# Patient Record
Sex: Female | Born: 1980 | Hispanic: Yes | Marital: Married | Smoking: Never smoker
Health system: Southern US, Community
[De-identification: ages and names within clinical notes are randomized; demographics above are authoritative.]

---

## 2022-01-04 ENCOUNTER — Other Ambulatory Visit: Payer: Self-pay

## 2022-01-04 DIAGNOSIS — N6310 Unspecified lump in the right breast, unspecified quadrant: Secondary | ICD-10-CM

## 2022-01-16 ENCOUNTER — Ambulatory Visit
Admission: RE | Admit: 2022-01-16 | Discharge: 2022-01-16 | Disposition: A | Discharge status: Home / Self Care | Payer: Self-pay | Source: Ambulatory Visit | Attending: Obstetrics and Gynecology | Admitting: Obstetrics and Gynecology

## 2022-01-16 ENCOUNTER — Other Ambulatory Visit: Payer: Self-pay | Admitting: Obstetrics and Gynecology

## 2022-01-16 ENCOUNTER — Ambulatory Visit
Admission: RE | Admit: 2022-01-16 | Discharge: 2022-01-16 | Disposition: A | Discharge status: Home / Self Care | Payer: No Typology Code available for payment source | Source: Ambulatory Visit | Attending: Obstetrics and Gynecology | Admitting: Obstetrics and Gynecology

## 2022-01-16 ENCOUNTER — Ambulatory Visit: Payer: Self-pay | Admitting: *Deleted

## 2022-01-16 VITALS — BP 106/72 | Wt 128.4 lb

## 2022-01-16 DIAGNOSIS — Z01419 Encounter for gynecological examination (general) (routine) without abnormal findings: Secondary | ICD-10-CM

## 2022-01-16 DIAGNOSIS — N6310 Unspecified lump in the right breast, unspecified quadrant: Secondary | ICD-10-CM

## 2022-01-16 DIAGNOSIS — N644 Mastodynia: Secondary | ICD-10-CM

## 2022-01-16 NOTE — Progress Notes (Signed)
[name] is a 41 y.o. No obstetric history on file. female who presents to [facility] clinic today with complaint of right outer breast lump and pain x one month. Patient states the pain comes and goes. Patient rates the pain at a 5-6 out of 10.    Pap Smear: Pap smear completed today. Last Pap smear was 5 years ago at a clinic in [place] and was normal per patient. Per patient has no history of an abnormal Pap smear. Last Pap smear result is not available in Epic.   Physical exam: Breasts Breasts symmetrical. No skin abnormalities bilateral breasts. No nipple retraction bilateral breasts. No nipple discharge bilateral breasts. No lymphadenopathy. No lumps palpated bilateral breasts. Unable to palpate a lump within patients area of concern within the right outer breast. Complaints of right outer breast pain on exam.     Pelvic/Bimanual Ext Genitalia No lesions, no swelling and no discharge observed on external genitalia.        Vagina Vagina pink and normal texture. No lesions and brownish blood observed in vagina consistent with patients menstrual period.        Cervix Cervix is present. Cervix pink and of normal texture. Brownish blood observed at cervical os consistent with patients menstrual period.   Uterus Uterus is present and palpable. Uterus in normal position and normal size.        Adnexae Bilateral ovaries present and palpable. No tenderness on palpation.         Rectovaginal No rectal exam completed today since patient had no rectal complaints. No skin abnormalities observed on exam.     Smoking History: Patient has never smoked.   Patient Navigation: Patient education provided. Access to services provided for patient through [facility] program. Spanish interpreter Natale Lay from [facility] provided. Patient has food insecurities. Patient escorted to the food market at the [employer] for groceries.   Breast and Cervical Cancer Risk  Assessment: Patient has family history of her sister having breast cancer. Patient has no known genetic mutations or history of radiation treatment to the chest before age 23. Patient does not have history of cervical dysplasia, immunocompromised, or DES exposure in-utero.  Risk Assessment     Risk Scores       01/16/2022   Last edited by: Meryl Dare, CMA   5-year risk: 0.5 %   Lifetime risk: 11.9 %            A: BCCCP exam with pap smear Complaint of right outer breast lump and pain.  P: Referred patient to the [facility] for a diagnostic mammogram. Appointment scheduled Tuesday, Jan 16, 2022 at 1240.  Priscille Heidelberg, RN 01/16/2022 9:56 AM

## 2022-01-16 NOTE — Patient Instructions (Signed)
Explained breast self awareness with Barbee Shropshire Maharaj. Pap smear completed today. Let her know BCCCP will cover Pap smears and HPV typing every 5 years unless has a history of abnormal Pap smears. Referred patient to the [facility] for a diagnostic mammogram. Appointment scheduled Tuesday, Jan 16, 2022 at 1240. Patient aware of appointment and will be there. Let patient know will follow up with her within the next couple weeks with results of Pap smear by phone. Barbee Shropshire Seltzer verbalized understanding.  Jaeshawn Silvio, Kathaleen Maser, RN 9:58 AM

## 2022-01-23 LAB — CYTOLOGY - PAP
Comment: NEGATIVE
Diagnosis: NEGATIVE
Diagnosis: REACTIVE
High risk HPV: POSITIVE — AB

## 2022-01-23 NOTE — Progress Notes (Signed)
Repeat in 1 year with cotesting

## 2022-01-24 ENCOUNTER — Telehealth: Payer: Self-pay

## 2022-01-24 NOTE — Telephone Encounter (Signed)
Called patient via [facility] [employer] # [phone] to give pap smear results. 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 Based on this result her next pap smear will be due in 1 year. Answered any questions that patient had regarding result. Patient voiced understanding.

## 2022-01-26 ENCOUNTER — Ambulatory Visit
Admission: RE | Admit: 2022-01-26 | Discharge: 2022-01-26 | Disposition: A | Discharge status: Home / Self Care | Payer: No Typology Code available for payment source | Source: Ambulatory Visit | Attending: Obstetrics and Gynecology | Admitting: Obstetrics and Gynecology

## 2022-01-26 DIAGNOSIS — N6310 Unspecified lump in the right breast, unspecified quadrant: Secondary | ICD-10-CM

## 2022-02-12 ENCOUNTER — Other Ambulatory Visit: Payer: Self-pay | Admitting: Obstetrics and Gynecology

## 2022-02-12 DIAGNOSIS — N6310 Unspecified lump in the right breast, unspecified quadrant: Secondary | ICD-10-CM

## 2022-02-15 ENCOUNTER — Ambulatory Visit: Payer: Self-pay | Admitting: *Deleted

## 2022-02-15 ENCOUNTER — Ambulatory Visit
Admission: RE | Admit: 2022-02-15 | Discharge: 2022-02-15 | Disposition: A | Discharge status: Home / Self Care | Payer: No Typology Code available for payment source | Source: Ambulatory Visit | Attending: Obstetrics and Gynecology | Admitting: Obstetrics and Gynecology

## 2022-02-15 VITALS — BP 102/68 | Wt 132.7 lb

## 2022-02-15 DIAGNOSIS — N644 Mastodynia: Secondary | ICD-10-CM

## 2022-02-15 DIAGNOSIS — N6310 Unspecified lump in the right breast, unspecified quadrant: Secondary | ICD-10-CM

## 2022-02-15 DIAGNOSIS — Z1239 Encounter for other screening for malignant neoplasm of breast: Secondary | ICD-10-CM

## 2022-02-15 NOTE — Progress Notes (Signed)
Ms. [place] Huberty is a 41 y.o. female who presents to [facility] clinic today with complaint of right upper outer breast  pain and lump. Patient stated the pain started after her right breast biopsy 01/26/2022 that comes and goes. Patient rates the pain at at 7-8 out of 10. Patient stated one week after the biopsy she noticed a lump and swelling.    Pap Smear: Pap smear not completed today. Last Pap smear was 01/16/2022 at [facility] clinic and was normal with positive HPV. Per patient has no history of an abnormal Pap smear. Last Pap smear result is available in Epic.   Physical exam: Breasts Breasts symmetrical. No skin abnormalities bilateral breasts. No nipple retraction bilateral breasts. No nipple discharge bilateral breasts. No lymphadenopathy. No lumps palpated bilateral breasts. Unable to palpate a lump within patients area of concern within the right outer breast. Swelling right upper breast on exam. Complaints of right upper outer breast pain on exam.    MM RT BREAST BX W LOC DEV 1ST LESION IMAGE BX SPEC STEREO GUIDE  Addendum Date: 01/31/2022   ADDENDUM REPORT: 01/31/2022 08:10 ADDENDUM: Pathology revealed Breast, right, needle core biopsy, upper, x shaped clip- BENIGN BREAST PARENCHYMA WITH FOCAL FIBROCYSTIC CHANGE. - NEGATIVE FOR CARCINOMA. This was found to be concordant by Dr. Baird Lyons. Pathology results were discussed with the patient by telephone with Durene Cal Bilingual Patient Services Representative. The patient reported doing well after the biopsy with tenderness at the site. Post biopsy instructions and care were reviewed and questions were answered. The patient was encouraged to call The [facility] for any additional concerns. The patient was instructed to continue with monthly self breast examinations, clinical follow-up as needed, and to return for annual mammography at 42. The patient was informed a reminder notice would be sent regarding this  appointment. Pathology results reported by Collene Mares RN on 01/30/2022. Electronically Signed   By: Baird Lyons M.D.   On: 01/31/2022 08:10   Result Date: 01/31/2022 CLINICAL DATA:  Right breast mass. EXAM: Choose 3 BREAST STEREOTACTIC CORE NEEDLE BIOPSY COMPARISON:  None Available. FINDINGS: The patient and I discussed the procedure of stereotactic-guided biopsy including benefits and alternatives. We discussed the high likelihood of a successful procedure. We discussed the risks of the procedure including infection, bleeding, tissue injury, clip migration, and inadequate sampling. Informed written consent was given. The usual time out protocol was performed immediately prior to the procedure. Using sterile technique and 1% lidocaine and 1% lidocaine with epinephrine as local anesthetic, under stereotactic guidance, a 9 gauge vacuum assisted device was used to perform core needle biopsy of an asymmetry in the upper slightly outer quadrant of the right breast using a lateral to medial approach. Lesion quadrant: Upper-outer quadrant At the conclusion of the procedure, X shaped tissue marker clip was deployed into the biopsy cavity. Follow-up 2-view mammogram was performed and dictated separately. IMPRESSION: Stereotactic-guided biopsy of the right breast. No apparent complications. Electronically Signed: By: Baird Lyons M.D. On: 01/26/2022 09:33  MM CLIP PLACEMENT RIGHT  Result Date: 01/26/2022 CLINICAL DATA:  Status post stereotactic biopsy of the right breast. EXAM: 3D DIAGNOSTIC RIGHT MAMMOGRAM POST STEREOTACTIC BIOPSY COMPARISON:  Previous exam(s). FINDINGS: 3D Mammographic images were obtained following stereotactic guided biopsy of the right breast. The biopsy marking clip is in the far posterior aspect of the upper-outer quadrant of the right breast. IMPRESSION: Appropriate positioning of the X shaped biopsy marking clip at the site of biopsy in  the upper-outer quadrant of the right breast. Final  Assessment: Post Procedure Mammograms for Marker Placement Electronically Signed   By: Baird Lyons M.D.   On: 01/26/2022 09:34  MS DIGITAL DIAG TOMO BILAT  Result Date: 01/16/2022 CLINICAL DATA:  Palpable lump in the right breast. Baseline mammography. Milky nipple discharge on the right. EXAM: DIGITAL DIAGNOSTIC BILATERAL MAMMOGRAM WITH TOMOSYNTHESIS AND CAD; ULTRASOUND RIGHT BREAST LIMITED TECHNIQUE: Bilateral digital diagnostic mammography and breast tomosynthesis was performed. The images were evaluated with computer-aided detection.; Targeted ultrasound examination of the right breast was performed COMPARISON:  None available. ACR Breast Density Category c: The breast tissue is heterogeneously dense, which may obscure small masses. FINDINGS: There is a mass in the superior right breast on the MLO view only just above the level of the nipple at a posterior depth measuring 1.4 cm. The mass persists on additional imaging. No other suspicious mammographic findings are identified bilaterally. On physical exam, no suspicious lumps are identified. Targeted ultrasound is performed, showing no abnormality in the retroareolar region to correlate with milky discharge. No abnormality identified in the region of the palpable lump on the right. No sonographic correlate for the right breast mass only seen on the MLO view at a posterior depth just above the level the nipple. IMPRESSION: Indeterminate right breast mass only visualized on the MLO view, not definitely a lymph node. No cause for the palpable lump identified. No cause for the milky nipple discharge identified. No suspicious findings otherwise seen in either breast. RECOMMENDATION: Recommend stereotactic biopsy of the right breast mass located on the MLO view at a posterior depth. I have discussed the findings and recommendations with the patient. If applicable, a reminder letter will be sent to the patient regarding the next appointment. BI-RADS CATEGORY  4:  Suspicious. Electronically Signed   By: Gerome Sam III M.D.   On: 01/16/2022 14:42    Pelvic/Bimanual Pap is not indicated today per BCCCP guidelines.   Smoking History: Patient has never smoked.   Patient Navigation: Patient education provided. Access to services provided for patient through [facility] program. Spanish interpreter Alene Mires from [facility] provided. Patient has food insecurities. Patient escorted to the food market at the [employer] for groceries.   Breast and Cervical Cancer Risk Assessment: Patient has family history of her sister having breast cancer. Patient has no known genetic mutations or history of radiation treatment to the chest before age 23. Patient does not have history of cervical dysplasia, immunocompromised, or DES exposure in-utero.  Risk Assessment     Risk Scores       02/15/2022 01/16/2022   Last edited by: Meryl Dare, CMA Meryl Dare, CMA   5-year risk: 0.5 % 0.5 %   Lifetime risk: 11.9 % 11.9 %            A: BCCCP exam without pap smear Complaint of right outer breast lump and pain after breast biopsy.  P: Referred patient to the [facility] for a right breast diagnostic mammogram. Appointment scheduled Thursday, February 15, 2022 at 1320.  Priscille Heidelberg, RN 02/15/2022 11:00 AM

## 2022-02-15 NOTE — Patient Instructions (Signed)
Explained breast self awareness with Barbee Shropshire Kerce. Patient did not need a Pap smear today due to last Pap smear and HPV typing was 01/16/2022. Let her know that her next Pap smear is due in one year due to it was negative with positive HPV. Referred patient to the [facility] for a right breast diagnostic mammogram. Appointment scheduled Thursday, February 15, 2022 at 1320. Patient aware of appointment and will be there. Barbee Shropshire Shafer verbalized understanding.  Alline Pio, Kathaleen Maser, RN 11:00 AM

## 2022-07-18 NOTE — ED Triage Notes (Signed)
 Interpreter [phone] patient c/o lower abd pain, and lower back pain since yesterday. Also, reports 3 positive pregnancy test at home. Also, reports urinary frequency and dysuria.

## 2022-07-18 NOTE — ED Notes (Addendum)
 Pt resting comfortably in bed. RR even and unlabored. No distress noted. Pt provided with warm blanket. Pt has no other request at this time. Bed locked and low. Call bell with reach. Family member at bedside.

## 2022-07-18 NOTE — ED Notes (Signed)
 Interpreter Louis [phone]

## 2022-07-18 NOTE — ED Provider Notes (Signed)
 ------------------------------------------------------------------------------- Attestation signed by Seaback, [place] Causey, MD at 07/19/2022  4:32 AM I was the attending physician on duty at the time the patient visited the ED.  The patient was evaluated by the PA/NP.  I was personally available for consultation, however, I did not see the patient nor participate in the medical decision making of the encounter unless otherwise documented.  I am administratively signing the chart. -------------------------------------------------------------------------------  Patient placed in First Look pathway, seen and evaluated for chief complaint of lower abdominal/back pain. Pertinent HPI findings include associated LMP 05/29/22, positive pregnancy test at home. Denies vaginal bleeding. Pertinent exam findings include non-toxic in appearance. Patient counseled on process, plan, and necessity for staying for completing the evaluation.   This document serves as a record of services personally performed by Charleen Fox PA-C. 5:58 PM  Interpreter [phone]  [facility] Emergency Department Emergency Department Provider Note  This document was created using the aid of voice recognition Dragon dictation software.   Provider at bedside: 07/18/2022 9:49 PM  History obtained from the: Patient  History   Chief Complaint  Patient presents with  . Abdominal Pain    HPI  Renee Knight is a 41 y.o. female G4P3 who presents to the ED with complaints of lower abdominal and back pain.  Patient states she first noted pain a few weeks ago.  States she took a at home pregnancy test few days ago which was notably positive.  LMP 05/29/2022.  She denies any vaginal bleeding.  Endorses associated urinary frequency and dysuria.  Denies any fever.  Denies any nausea, vomiting, diarrhea or other medical complaints at this time.   9:49 PM Previous medical records reviewed from [facility] Care Everywhere and  EPIC Chart Review.   Patient's last menstrual period was 05/29/2022 (exact date).  Past Medical History History reviewed. No pertinent past medical history.  Past Surgical History History reviewed. No pertinent surgical history.  Medications These were reviewed. See nursing note for details.  Allergies Patient has no known allergies.  Family History History reviewed. No pertinent family history.  Social History Social History   Tobacco Use  . Smoking status: Never  . Smokeless tobacco: Never  Substance Use Topics  . Alcohol use: Not Currently  . Drug use: Not Currently    All family history, social history and PMH were reviewed by myself. See nursing note for details.   Review of Systems  Review of Systems  Gastrointestinal: Positive for abdominal pain.  Genitourinary: Positive for dysuria and frequency.  Musculoskeletal: Positive for back pain.    Physical Exam   Vitals:   07/18/22 2115 07/18/22 2130 07/18/22 2145 07/18/22 2203  BP:      Pulse:      Temp:      Resp:      SpO2: 99% 98% 99%   PainSc:    0-Zero    Physical Exam Vitals and nursing note reviewed.  Constitutional:      General: She is not in acute distress.    Appearance: She is well-developed.  HENT:     Head: Normocephalic and atraumatic.     Mouth/Throat:     Mouth: Mucous membranes are moist.  Eyes:     Extraocular Movements: Extraocular movements intact.  Cardiovascular:     Rate and Rhythm: Normal rate and regular rhythm.     Heart sounds: Normal heart sounds.  Pulmonary:     Effort: Pulmonary effort is normal.     Breath sounds:  Normal breath sounds.  Abdominal:     General: There is no distension.     Palpations: Abdomen is soft.     Tenderness: There is abdominal tenderness (Mild distractible generalized nominal tenderness). There is no right CVA tenderness or left CVA tenderness.  Genitourinary:    Comments: Deferred Musculoskeletal:     Comments: Bilateral lumbar  paraspinal tenderness.  Neurological:     Mental Status: She is alert.     Results  LABS Labs Reviewed  COMPREHENSIVE METABOLIC PANEL - Abnormal; Notable for the following components:      Result Value   Potassium 3.4 (*)    CO2 21 (*)    All other components within normal limits  URINALYSIS WITH MICROSCOPIC - Abnormal; Notable for the following components:   Urine Appearance Cloudy (*)    Urine Specific Gravity 1.034 (*)    Urine Protein 30 (*)    Urine Ketone Trace (*)    Urine Blood/Hb 3+ (*)    Urine Leukocyte Esterase 250 (*)    Urine WBC 6 (*)    Urine RBC 25 (*)    Urine Squamous Epithelial Cells TNTC (*)    All other components within normal limits  HCG - Abnormal; Notable for the following components:   HCG 85,952 (*)    All other components within normal limits  URINE CULTURE  CBC AND DIFFERENTIAL   Narrative:    The following orders were created for panel order CBC and Differential. Procedure                               Abnormality         Status                    ---------                               -----------         ------                    CBC and Differential[[id number]]                             Final result               Please view results for these tests on the individual orders.  HOLD FOR URINE CULTURE  CBC WITH AUTO DIFFERENTIAL PANEL  EXTRA TUBE DRAW   Narrative:    The following orders were created for panel order Extra Tube Draw. Procedure                               Abnormality         Status                    ---------                               -----------         ------                    Hnoi[[id number]]  In process                Light Aolz[[id number]]                                       In process                 Please view results for these tests on the individual orders.  GOLD  LIGHT BLUE    Radiology   Results for orders placed or performed during the hospital encounter  of 07/18/22  US  PREGNANT PELVIS 1ST TRIMESTER & TRANSVAGINAL   Narrative   CLINICAL DATA:  Pregnant patient in first-trimester pregnancy with abdominal and pelvic pain.  EXAM: OBSTETRIC <14 WK US  AND TRANSVAGINAL OB US   TECHNIQUE: Both transabdominal and transvaginal ultrasound examinations were performed for complete evaluation of the gestation as well as the maternal uterus, adnexal regions, and pelvic cul-de-sac. Transvaginal technique was performed to assess early pregnancy.  COMPARISON:  None Available.  FINDINGS: Intrauterine gestational sac: Single  Yolk sac:  Visualized.  Embryo:  Visualized.  Cardiac Activity: Visualized.  Heart Rate: 157 bpm  CRL:  3.9 mm   6 w   0 d                  US  EDC: 03/13/2023  Subchorionic hemorrhage:  Small, 5 x 11 mm..  Maternal uterus/adnexae: The right ovary is visualized and is normal. Ovarian blood flow is demonstrated. The left ovary is normal in size. Physiologic follicle. Ovarian blood flow is seen. No adnexal mass. No pelvic free fluid.  IMPRESSION: 1. Single live intrauterine pregnancy estimated gestational age based on crown-rump length 6 weeks 0 days for ultrasound [facility] 03/13/2023. 2. Small subchorionic hemorrhage.   Electronically Signed   By: Andrea Gasman M.D.   On: 07/18/2022 20:52      ED Course   ED Course as of 07/18/22 2349  Wed Jul 18, 2022  2114 WBC: 7.9 [KL]  2114 HCG(!): 14,047 [KL]  2137 Urinalysis With Microscopic(!) Leuks, WBC noted on UA, suspect contaminated urine with TNTC sqaum cells noted. However patient is presenting with dysuria, in the setting of early pregnancy will treat with abx, cultures pending  [KL]    ED Course User Index [KL] Olene Charleen Maxwell, PA-C    Medications Given in Emergency Department  Medications - No data to display   Medical Decision Making  Medical Decision Making Abdominal pain, acute, generalized: acute illness or injury with systemic  symptoms Dysuria: acute illness or injury with systemic symptoms First trimester pregnancy: acute illness or injury with systemic symptoms Amount and/or Complexity of Data Reviewed Independent Historian: spouse External Data Reviewed: notes. Labs: ordered. Decision-making details documented in ED Course. Radiology: ordered and independent interpretation performed. Decision-making details documented in ED Course.   Risk OTC drugs. Prescription drug management.       Additional Information: I reviewed the patient's past medical history, including notes from our facility and what is available in CareEverywhere.  Differentials considered: appendicitis, bowel obstruction, AAA, UTI, pyelonephritis, nephrolithiasis, pancreatitis, cholecystitis, shingles, perforated bowel or ulcer, diverticulitis, mesenteric ischemia, inflammatory bowel disease, or strangulated/incarcerated hernia.  On my initial exam, the pt was hemodynamically stable, nontoxic-appearing and in no acute distress.  Physical examination as described above.  Upon work-up today, CBC without leukocytosis, anemia or thrombocytopenia.  CMP without significant electrolyte disturbances, renal hepatic function within normal  limits.  hCG is elevated consistent with early pregnancy.  Urinalysis with leuks WBCs and RBCs however several urine squames are also noted.  Patient is symptomatic.  Will treat with Keflex.  Ultrasound does reveal approximate 6-week gestation IUP.  Suspect symptoms are related to early pregnancy.  Overall work-up and exam today is reassuring.  No further emergent evaluation necessary at this time.    The following decision tools were used to help assess clinical picture and make decisions: Patient's history, physical exam, labs, imaging   ED Clinical Impression   Clinical Complexity Number and complexity of problems addressed: Patient's presentation is most consistent with acute illness / injury with systemic  symptoms..  Amount and/or complexity of data reviewed, analyzed: Assessment requiring independent historians used: Patient's spouse, at bedside  External notes reviewed: Prior PCP visit, prior emergency department visits from outside hospital, prior subspecialist consultation visit  ER provider interpretation of Imaging / Radiology: IUP noted on ultrasound  ER provider interpretation of Labs: No anemia on CBC  All radiology studies reviewed independently, additionally reading provided by radiologist available, unless otherwise noted.    Risk of complications and/or morbidity or mortality of patient management: Patient's impaired access to primary care increases the complexity of managing their  presentation.   Plan: Discharge patient home with prescription of Keflex to be taken 4 times daily for 7 days.  Patient also given prescription for prenatal vitamins.  Discussed Tylenol  for pain.  Referral to OB placed.  Return to ER precautions given.  Patient agreeable to and expressed understanding of above.  Clinical picture was discussed with patient along with risks and benefits of management options, shared decision making we will discharge the patient home with close outpatient follow-up for continued management. Based on the above findings, I believe patient is hemodynamically stable for discharge.   The following prescriptions were given for continued management of symptoms or pathologies: Keflex, prenatal vitamin  Patient/and family educated about specific return precautions for given chief complaint and symptoms.  Patient/and family educated about follow-up with PCP and OBGYN.  Patient/and family expressed understanding of return precautions and need for follow-up.  Patient discharged. Diagnosis, treatment, plan discussed with patient.  All questions were answered to the patient's satisfaction.   If patient was given medication(s), adverse effects, black box warnings, and drug  interactions were discussed with patient.  Patient instructed to follow-up here in the emergency department for new or worsening symptoms.     1. Abdominal pain, acute, generalized   2. Dysuria   3. First trimester pregnancy      Medication List    START taking these medications   cephalexin 500 MG capsule Commonly known as: KEFLEX Take 1 capsule (500 mg total) by mouth 4 times daily for 7 days.   Prenatal Complete 14 mg iron- 400 mcg Tab Generic drug: prenatal 21-iron fu-folic acid Take 1 tablet by mouth daily.     ASK your doctor about these medications   aspirin-acetaminophen -caffeine 250-250-65 mg per tablet Commonly known as: EXCEDRIN MIGRAINE       Where to Get Your Medications    These medications were sent to [id number] - [place], [place] - [address]  [address], [place] [address]   Phone: [phone]  cephalexin 500 MG capsule Prenatal Complete 14 mg iron- 400 mcg Tab    FOLLOW UP Emergency - [place], [facility] [address] [place]  [address] [phone] Go to  If symptoms worsen, As needed  _____________________________  Attestation: Charleen Fox, PA-C obtained and performed the history, physical exam and medical decision making elements that were entered into the chart. 07/18/2022  11:49 PM          Electronically signed by: Fox Charleen Maxwell, PA-C 07/18/22 2349    Electronically signed by: Seaback, [place] Causey, MD 07/19/22 [phone]

## 2022-08-09 ENCOUNTER — Inpatient Hospital Stay (HOSPITAL_COMMUNITY)
Admission: AD | Admit: 2022-08-09 | Discharge: 2022-08-10 | Disposition: A | Discharge status: Home / Self Care | Payer: No Typology Code available for payment source | Attending: Obstetrics and Gynecology | Admitting: Obstetrics and Gynecology

## 2022-08-09 ENCOUNTER — Inpatient Hospital Stay (HOSPITAL_COMMUNITY): Payer: No Typology Code available for payment source

## 2022-08-09 ENCOUNTER — Encounter (HOSPITAL_COMMUNITY): Payer: Self-pay | Admitting: Obstetrics and Gynecology

## 2022-08-09 DIAGNOSIS — Z3A09 9 weeks gestation of pregnancy: Secondary | ICD-10-CM

## 2022-08-09 DIAGNOSIS — O364XX0 Maternal care for intrauterine death, not applicable or unspecified: Secondary | ICD-10-CM | POA: Insufficient documentation

## 2022-08-09 DIAGNOSIS — O021 Missed abortion: Secondary | ICD-10-CM | POA: Diagnosis present

## 2022-08-09 DIAGNOSIS — O26891 Other specified pregnancy related conditions, first trimester: Secondary | ICD-10-CM

## 2022-08-09 LAB — WET PREP, GENITAL
Clue Cells Wet Prep HPF POC: NONE SEEN
Sperm: NONE SEEN
Trich, Wet Prep: NONE SEEN
WBC, Wet Prep HPF POC: <10 (ref <10)
Yeast Wet Prep HPF POC: NONE SEEN

## 2022-08-09 LAB — URINALYSIS, ROUTINE W REFLEX MICROSCOPIC
Bacteria, UA: NONE SEEN
Bilirubin Urine: NEGATIVE
Glucose, UA: NEGATIVE mg/dL
Ketones, ur: NEGATIVE mg/dL
Leukocytes,Ua: NEGATIVE
Nitrite: NEGATIVE
Protein, ur: NEGATIVE mg/dL
Specific Gravity, Urine: 1.026 (ref 1.005–1.030)
pH: 6 (ref 5.0–8.0)

## 2022-08-09 NOTE — MAU Note (Signed)
..  Renee Knight is a 41 y.o. at [redacted]w[redacted]d here in MAU reporting: abdominal cramping and back pain that comes and goes.  Denies vaginal bleeding.  Reports she has had excessive vaginal discharge two days ago.  Pain score: 10/10 when its at its worst Vitals:   08/09/22 2052  BP: 105/61  Pulse: 72  Resp: 15  Temp: 98.5 F (36.9 C)  SpO2: 100%     FHT: n/a Lab orders placed from triage:  ua

## 2022-08-09 NOTE — MAU Provider Note (Signed)
Chief Complaint: Abdominal Pain and Back Pain  Seen by provider at 2320       SUBJECTIVE HPI: Renee Knight is a 41 y.o. G1P0 at [redacted]w[redacted]d by LMP who presents to maternity admissions reporting intermittent cramping in lower abdomen and low back. Has had increased discharge.  . She denies vaginal bleeding, vaginal itching/burning, urinary symptoms, h/a, dizziness, n/v, or fever/chills.    Had an [place] at 6 weeks in [place] at the ED which showed a normal live IUP   Has not established prenatal care yet.  Lives in [place].   Abdominal Pain This is a new problem. The current episode started in the past 7 days. The problem occurs intermittently. The problem has been waxing and waning. The quality of the pain is cramping. Pertinent negatives include no constipation, diarrhea, dysuria, fever, frequency or myalgias. Nothing aggravates the pain. The pain is relieved by Nothing. She has tried nothing for the symptoms.  Back Pain This is a new problem. The current episode started in the past 7 days. The problem occurs intermittently. The pain is present in the lumbar spine. The quality of the pain is described as cramping. Associated symptoms include abdominal pain. Pertinent negatives include no dysuria, fever or weakness.   RN Note: Renee Knight is a 41 y.o. at [redacted]w[redacted]d here in MAU reporting: abdominal cramping and back pain that comes and goes.  Denies vaginal bleeding.  Reports she has had excessive vaginal discharge two days ago.  Pain score: 10/10 when its at its worst  No past medical history on file. No past surgical history on file. Social History   Socioeconomic History   Marital status: Married    Spouse name: Not on file   Number of children: Not on file   Years of education: Not on file   Highest education level: Not on file  Occupational History   Not on file  Tobacco Use   Smoking status: Never   Smokeless tobacco: Never  Vaping Use   Vaping Use: Never used   Substance and Sexual Activity   Alcohol use: Never   Drug use: Never   Sexual activity: Not Currently    Birth control/protection: Condom  Other Topics Concern   Not on file  Social History Narrative   Not on file   Social Determinants of Health   Financial Resource Strain: Not on file  Food Insecurity: Food Insecurity Present (02/15/2022)   Hunger Vital Sign    Worried About Running Out of Food in the Last Year: Often true    Ran Out of Food in the Last Year: Often true  Transportation Needs: No Transportation Needs (02/15/2022)   PRAPARE - Administrator, Civil Service (Medical): No    Lack of Transportation (Non-Medical): No  Physical Activity: Not on file  Stress: Not on file  Social Connections: Not on file  Intimate Partner Violence: Not on file   No current facility-administered medications on file prior to encounter.   Current Outpatient Medications on File Prior to Encounter  Medication Sig Dispense Refill   aspirin-acetaminophen-caffeine (EXCEDRIN MIGRAINE) 250-250-65 MG tablet Take by mouth every 6 (six) hours as needed for headache.     No Known Allergies  I have reviewed patient's Past Medical Hx, Surgical Hx, Family Hx, Social Hx, medications and allergies.   ROS:  Review of Systems  Constitutional:  Negative for fever.  Gastrointestinal:  Positive for abdominal pain. Negative for constipation and diarrhea.  Genitourinary:  Negative for dysuria and frequency.  Musculoskeletal:  Positive for back pain. Negative for myalgias.  Neurological:  Negative for weakness.   Review of Systems  Other systems negative   Physical Exam  Physical Exam Patient Vitals for the past 24 hrs:  BP Temp Temp src Pulse Resp SpO2 Height Weight  08/09/22 2052 105/61 98.5 F (36.9 C) Oral 72 15 100 % 5' 1.02" (1.55 m) 61.5 kg   Constitutional: Well-developed, well-nourished female in no acute distress.  Cardiovascular: normal rate Respiratory: normal effort GI:  Abd soft, non-tender.  MS: Extremities nontender, no edema, normal ROM Neurologic: Alert and oriented x 4.  GU: Neg CVAT.  PELVIC EXAM: deferred in lieu of transvaginal ultrasound  LAB RESULTS Results for orders placed or performed during the hospital encounter of 08/09/22 (from the past 24 hour(s))  Urinalysis, Routine w reflex microscopic Urine, Clean Catch     Status: Abnormal   Collection Time: 08/09/22  9:09 PM  Result Value Ref Range   Color, Urine YELLOW YELLOW   APPearance HAZY (A) CLEAR   Specific Gravity, Urine 1.026 1.005 - 1.030   pH 6.0 5.0 - 8.0   Glucose, UA NEGATIVE NEGATIVE mg/dL   Hgb urine dipstick SMALL (A) NEGATIVE   Bilirubin Urine NEGATIVE NEGATIVE   Ketones, ur NEGATIVE NEGATIVE mg/dL   Protein, ur NEGATIVE NEGATIVE mg/dL   Nitrite NEGATIVE NEGATIVE   Leukocytes,Ua NEGATIVE NEGATIVE   RBC / HPF 11-20 0 - 5 RBC/hpf   WBC, UA 0-5 0 - 5 WBC/hpf   Bacteria, UA NONE SEEN NONE SEEN   Squamous Epithelial / LPF 6-10 0 - 5   Mucus PRESENT   Wet prep, genital     Status: None   Collection Time: 08/09/22  9:39 PM  Result Value Ref Range   Yeast Wet Prep HPF POC NONE SEEN NONE SEEN   Trich, Wet Prep NONE SEEN NONE SEEN   Clue Cells Wet Prep HPF POC NONE SEEN NONE SEEN   WBC, Wet Prep HPF POC <10 <10   Sperm NONE SEEN     IMAGING US OB Limited  Result Date: 08/09/2022 CLINICAL DATA:  Pelvic pain and known pregnancy EXAM: OBSTETRIC <14 WK ULTRASOUND TECHNIQUE: Transabdominal ultrasound was performed for evaluation of the gestation as well as the maternal uterus and adnexal regions. COMPARISON:  07/18/2022 FINDINGS: Intrauterine gestational sac: Present Yolk sac:  Present Embryo:  Present Cardiac Activity: Absent CRL:   12.4 mm   7 w 3 d Subchorionic hemorrhage:  Small subchorionic hemorrhage is noted. Maternal uterus/adnexae: Ovaries are within normal limits. Corpus luteum cyst is noted on the left. IMPRESSION: Findings meet definitive criteria for failed  pregnancy. This follows SRU consensus guidelines: Diagnostic Criteria for Nonviable Pregnancy Early in the First Trimester. Alison Stalling J Med [phone]. Critical Value/emergent results were called by telephone at the time of interpretation on 08/09/2022 at 11:39 pm to [facility], CNM , who verbally acknowledged these results. Electronically Signed   By: Inez Catalina M.D.   On: 08/09/2022 23:40     MAU Management/MDM: I have reviewed the triage vital signs and the nursing notes.   Pertinent labs & imaging results that were available during my care of the patient were reviewed by me and considered in my medical decision making (see chart for details).      I have reviewed her medical records including past results, notes and treatments.   Ordered Ultrasound to rule out SAB   Did not order labs since  she had already been seen at 6 week verifying intrauterine gestation.  This bleeding/pain can represent a normal pregnancy with bleeding, spontaneous abortion or even an ectopic which can be life-threatening.  The process as listed above helps to determine which of these is present.  Discussed findings of Missed abortion with patient and spouse using interpretor.  They are appropriately grieving Discussed options of expectant management, Cytotec and D&C.  She elects to use Cytotec to complete process Discussed process of procedure and warning signs to come in for. Will message clinic for followup appointment.  ASSESSMENT Single IUP at [redacted]w[redacted]d Fetal Demise at 7 weeks size Pelvic cramping, intermittent.   PLAN Discharge home  Early Intrauterine Pregnancy Failure Protocol  X Documented intrauterine pregnancy failure less than or equal to [redacted] weeks gestation  X No serious current illness  X Baseline Hgb greater than or equal to 10g/dl  X Patient has easily accessible transportation to the hospital  X Clear preference  X Practitioner/physician deems patient reliable  X Counseling by  practitioner or physician  X Patient education by RN  X_ Cytotec 800 mcg buccally by patient at home  Rx Percocet for pain Rx Zofran for nausea Followup in office 2-4wks  Pt stable at time of discharge. Encouraged to return here if she develops worsening of symptoms, increase in pain, fever, or other concerning symptoms.    Hansel Feinstein CNM, MSN Certified Nurse-Midwife 08/09/2022  10:36 PM

## 2022-08-10 ENCOUNTER — Encounter (HOSPITAL_COMMUNITY): Payer: Self-pay | Admitting: Obstetrics and Gynecology

## 2022-08-10 DIAGNOSIS — R102 Pelvic and perineal pain: Secondary | ICD-10-CM

## 2022-08-10 DIAGNOSIS — Z3A09 9 weeks gestation of pregnancy: Secondary | ICD-10-CM

## 2022-08-10 DIAGNOSIS — O26891 Other specified pregnancy related conditions, first trimester: Secondary | ICD-10-CM

## 2022-08-10 DIAGNOSIS — O021 Missed abortion: Secondary | ICD-10-CM | POA: Diagnosis present

## 2022-08-10 LAB — GC/CHLAMYDIA PROBE AMP (~~LOC~~) NOT AT ARMC
Chlamydia: NEGATIVE
Comment: NEGATIVE
Comment: NORMAL
Neisseria Gonorrhea: NEGATIVE

## 2022-08-10 MED ORDER — MISOPROSTOL 200 MCG PO TABS
ORAL_TABLET | ORAL | 1 refills | Status: DC
Start: 2022-08-10 — End: 2023-09-26

## 2022-08-10 MED ORDER — OXYCODONE-ACETAMINOPHEN 5-325 MG PO TABS: 1.0000 | ORAL_TABLET | Freq: Four times a day (QID) | ORAL | 0 refills | Status: DC | PRN

## 2022-08-10 MED ORDER — ONDANSETRON 4 MG PO TBDP: 4.0000 mg | ORAL_TABLET | Freq: Four times a day (QID) | ORAL | 0 refills | Status: DC | PRN

## 2022-08-30 ENCOUNTER — Encounter: Payer: No Typology Code available for payment source | Admitting: Family Medicine

## 2022-11-26 ENCOUNTER — Telehealth: Payer: Self-pay

## 2022-11-26 NOTE — Telephone Encounter (Signed)
Using [employer], [place], [place]: [id number], a voice message was received from patients friend, Alleen Borne, who is listed in her contacts, requesting to schedule an appt. She stated in the message that pt needs an appointment as there is something going on in one of her breasts.  We attempted to call the pt and her friend back and a voicemail was left for them both to return the call.

## 2022-11-27 ENCOUNTER — Other Ambulatory Visit: Payer: Self-pay

## 2022-11-27 DIAGNOSIS — N644 Mastodynia: Secondary | ICD-10-CM

## 2022-11-27 DIAGNOSIS — N6452 Nipple discharge: Secondary | ICD-10-CM

## 2022-11-27 NOTE — Telephone Encounter (Signed)
Per LPN, [employer], pt returned the call and is in the process of getting scheduled.

## 2022-12-06 ENCOUNTER — Ambulatory Visit
Admission: RE | Admit: 2022-12-06 | Discharge: 2022-12-06 | Disposition: A | Discharge status: Home / Self Care | Payer: No Typology Code available for payment source | Source: Ambulatory Visit | Attending: Obstetrics and Gynecology | Admitting: Obstetrics and Gynecology

## 2022-12-06 ENCOUNTER — Other Ambulatory Visit: Payer: Self-pay | Admitting: Obstetrics and Gynecology

## 2022-12-06 ENCOUNTER — Ambulatory Visit: Payer: Self-pay | Admitting: Hematology and Oncology

## 2022-12-06 VITALS — BP 108/72 | Wt 154.5 lb

## 2022-12-06 DIAGNOSIS — N6452 Nipple discharge: Secondary | ICD-10-CM

## 2022-12-06 DIAGNOSIS — N644 Mastodynia: Secondary | ICD-10-CM

## 2022-12-06 NOTE — Progress Notes (Signed)
[name] is a 42 y.o. female who presents to [facility] clinic today with complaint of bilateral breast pain.    Pap Smear: Pap not smear completed today. Last Pap smear was 12/2021 and was  normal with HPV+ . Per patient has no history of an abnormal Pap smear. Last Pap smear result is available in Epic.   Physical exam: Breasts Breasts symmetrical. No skin abnormalities bilateral breasts. No nipple retraction bilateral breasts. No nipple discharge bilateral breasts. No lymphadenopathy. No lumps palpated bilateral breasts.     MS DIGITAL DIAG TOMO UNI RIGHT  Result Date: 02/15/2022 CLINICAL DATA:  42 year old female presenting for evaluation of a possible lump as well as pain in the right breast at the site of recent biopsy. Patient had a benign right breast biopsy in June 2023 demonstrating benign tissue. Patient has a history of breast cancer in her sister at age 36. EXAM: DIGITAL DIAGNOSTIC UNILATERAL RIGHT MAMMOGRAM WITH TOMOSYNTHESIS AND CAD; ULTRASOUND RIGHT BREAST LIMITED TECHNIQUE: Right digital diagnostic mammography and breast tomosynthesis was performed. The images were evaluated with computer-aided detection.; Targeted ultrasound examination of the right breast was performed COMPARISON:  Previous exam(s). ACR Breast Density Category c: The breast tissue is heterogeneously dense, which may obscure small masses. FINDINGS: Mammogram: Right breast: A skin BB marks the site of the lump reported by the patient in the superior central right breast. A spot tangential view of this area was performed in addition to standard views. There is no new abnormality at the palpable site. An X shaped biopsy marking clip is seen at the site of recent benign biopsy in the right breast. There are mild post biopsy changes. No new mass. On physical exam at the site of concern reported by the patient in the superior central right breast I do not feel a discrete mass or focal area of thickening. At the  site of recent biopsy the also in superior breast I do not feel a discrete mass or focal area of thickening. There is no erythema or rash. Ultrasound: Targeted ultrasound performed at the site of concern reported by the patient in the right breast at 12 o'clock 6 cm from the nipple demonstrating no cystic or solid mass. IMPRESSION: 1. No mammographic or sonographic evidence of malignancy or other imaging abnormality at the palpable site of concern reported by the patient in the right breast at 12 o'clock. 2.  Stable benign biopsy site in the right breast. RECOMMENDATION: 1. Recommend any further workup of the palpable site in the right breast be on a clinical basis. 2. Counseled the patient at biopsy site pain may take several weeks to subside. 3. Consider risk assessment to determine the patient's lifetime risk of breast cancer given her family history, if this has not already been performed. Per American Cancer Society guidelines, if the patient has a calculated lifetime risk of developing breast cancer of greater than 20%, annual screening MRI of the breasts would be recommended at the time of screening mammography. I have discussed the findings and recommendations with the patient. If applicable, a reminder letter will be sent to the patient regarding the next appointment. BI-RADS CATEGORY  2: Benign. Electronically Signed   By: Emmaline Kluver M.D.   On: 02/15/2022 15:17  MM RT BREAST BX W LOC DEV 1ST LESION IMAGE BX SPEC STEREO GUIDE  Addendum Date: 01/31/2022   ADDENDUM REPORT: 01/31/2022 08:10 ADDENDUM: Pathology revealed Breast, right, needle core biopsy, upper, x shaped clip- BENIGN BREAST PARENCHYMA WITH FOCAL  FIBROCYSTIC CHANGE. - NEGATIVE FOR CARCINOMA. This was found to be concordant by Dr. Baird Lyonsina Arceo. Pathology results were discussed with the patient by telephone with Durene CalMargarita Ankeney Bilingual Patient Services Representative. The patient reported doing well after the biopsy with tenderness at  the site. Post biopsy instructions and care were reviewed and questions were answered. The patient was encouraged to call [facility] for any additional concerns. The patient was instructed to continue with monthly self breast examinations, clinical follow-up as needed, and to return for annual mammography at 42. The patient was informed a reminder notice would be sent regarding this appointment. Pathology results reported by Collene MaresSusan Eaton RN on 01/30/2022. Electronically Signed   By: Baird Lyonsina  Arceo M.D.   On: 01/31/2022 08:10   Result Date: 01/31/2022 CLINICAL DATA:  Right breast mass. EXAM: Choose 3 BREAST STEREOTACTIC CORE NEEDLE BIOPSY COMPARISON:  None Available. FINDINGS: The patient and I discussed the procedure of stereotactic-guided biopsy including benefits and alternatives. We discussed the high likelihood of a successful procedure. We discussed the risks of the procedure including infection, bleeding, tissue injury, clip migration, and inadequate sampling. Informed written consent was given. The usual time out protocol was performed immediately prior to the procedure. Using sterile technique and 1% lidocaine and 1% lidocaine with epinephrine as local anesthetic, under stereotactic guidance, a 9 gauge vacuum assisted device was used to perform core needle biopsy of an asymmetry in the upper slightly outer quadrant of the right breast using a lateral to medial approach. Lesion quadrant: Upper-outer quadrant At the conclusion of the procedure, X shaped tissue marker clip was deployed into the biopsy cavity. Follow-up 2-view mammogram was performed and dictated separately. IMPRESSION: Stereotactic-guided biopsy of the right breast. No apparent complications. Electronically Signed: By: Baird Lyonsina  Arceo M.D. On: 01/26/2022 09:33  MM CLIP PLACEMENT RIGHT  Result Date: 01/26/2022 CLINICAL DATA:  Status post stereotactic biopsy of the right breast. EXAM: 3D DIAGNOSTIC RIGHT MAMMOGRAM POST  STEREOTACTIC BIOPSY COMPARISON:  Previous exam(s). FINDINGS: 3D Mammographic images were obtained following stereotactic guided biopsy of the right breast. The biopsy marking clip is in the far posterior aspect of the upper-outer quadrant of the right breast. IMPRESSION: Appropriate positioning of the X shaped biopsy marking clip at the site of biopsy in the upper-outer quadrant of the right breast. Final Assessment: Post Procedure Mammograms for Marker Placement Electronically Signed   By: Baird Lyonsina  Arceo M.D.   On: 01/26/2022 09:34  MS DIGITAL DIAG TOMO BILAT  Result Date: 01/16/2022 CLINICAL DATA:  Palpable lump in the right breast. Baseline mammography. Milky nipple discharge on the right. EXAM: DIGITAL DIAGNOSTIC BILATERAL MAMMOGRAM WITH TOMOSYNTHESIS AND CAD; ULTRASOUND RIGHT BREAST LIMITED TECHNIQUE: Bilateral digital diagnostic mammography and breast tomosynthesis was performed. The images were evaluated with computer-aided detection.; Targeted ultrasound examination of the right breast was performed COMPARISON:  None available. ACR Breast Density Category c: The breast tissue is heterogeneously dense, which may obscure small masses. FINDINGS: There is a mass in the superior right breast on the MLO view only just above the level of the nipple at a posterior depth measuring 1.4 cm. The mass persists on additional imaging. No other suspicious mammographic findings are identified bilaterally. On physical exam, no suspicious lumps are identified. Targeted ultrasound is performed, showing no abnormality in the retroareolar region to correlate with milky discharge. No abnormality identified in the region of the palpable lump on the right. No sonographic correlate for the right breast mass only seen [facility] the [facility]  view at a posterior depth just above the level the nipple. IMPRESSION: Indeterminate right breast mass only visualized on the MLO view, not definitely a lymph node. No cause for the palpable lump identified.  No cause for the milky nipple discharge identified. No suspicious findings otherwise seen in either breast. RECOMMENDATION: Recommend stereotactic biopsy of the right breast mass located on the MLO view at a posterior depth. I have discussed the findings and recommendations with the patient. If applicable, a reminder letter will be sent to the patient regarding the next appointment. BI-RADS CATEGORY  4: Suspicious. Electronically Signed   By: Gerome Sam III M.D.   On: 01/16/2022 14:42     Pelvic/Bimanual Pap is not indicated today    Smoking History: Patient has never smoked and was not referred to quit line.    Patient Navigation: Patient education provided. Access to services provided for patient through [facility] program. Alis interpreter provided. No transportation provided   Colorectal Cancer Screening: Per patient has never had colonoscopy completed No complaints today.    Breast and Cervical Cancer Risk Assessment: Patient has family history of breast cancer with her sister at 32. Patient does not have history of cervical dysplasia, immunocompromised, or DES exposure in-utero.  Risk Assessment   No risk assessment data for the current encounter  Risk Scores       02/15/2022   Last edited by: Meryl Dare, CMA   5-year risk: 0.5 %   Lifetime risk: 11.9 %            A: [facility] exam without pap smear Complaint of bilateral breast pain with benign exam.   P: Referred patient to the [facility] for a diagnostic mammogram. Appointment scheduled 12/06/22.  Pascal Lux, NP 12/06/2022 1:58 PM

## 2022-12-06 NOTE — Patient Instructions (Signed)
Taught Renee Knight about self breast awareness and gave educational materials to take home. Patient did not need a Pap smear today due to last Pap smear was in 12/2021 per patient. She will be due for Pap in 12/2022 due to HPV+. Let her know [facility] will cover Pap smears every 3 years unless has a history of abnormal Pap smears. Referred patient to the [facility] for diagnostic mammogram. Appointment scheduled for 12/06/22. Patient aware of appointment and will be there. Let patient know will follow up with her within the next couple weeks with results. Renee Knight verbalized understanding.  Pascal Lux, NP 2:00 PM

## 2023-01-24 ENCOUNTER — Ambulatory Visit: Payer: Self-pay

## 2023-01-24 ENCOUNTER — Telehealth: Payer: Self-pay

## 2023-01-24 NOTE — Telephone Encounter (Signed)
Telephoned patient using interpreter#[id number]. Left voice message with [facility] (reschedule appt) contact information.

## 2023-02-11 ENCOUNTER — Other Ambulatory Visit: Payer: Self-pay

## 2023-02-11 ENCOUNTER — Inpatient Hospital Stay: Payer: Self-pay | Attending: Obstetrics and Gynecology | Admitting: *Deleted

## 2023-02-11 VITALS — BP 110/72 | Ht 60.0 in | Wt 161.2 lb

## 2023-02-11 DIAGNOSIS — Z0000 Encounter for general adult medical examination without abnormal findings: Secondary | ICD-10-CM

## 2023-02-11 NOTE — Progress Notes (Signed)
[facility] initial screening   Interpreter- Natale Lay, [place]   Clinical Measurement:  Vitals:   02/11/23 0902 02/11/23 0944  BP: 112/70 110/72   Fasting Labs Drawn Today, will review with patient when they result.   Medical History: Patient states that she does not know if she has high cholesterol, does not have high blood pressure and she does not have diabetes.  Medications: Patient states that she does not take medication to lower cholesterol, blood pressure or blood sugar.  Patient does not take an aspirin a day to help prevent a heart attack or stroke.    Blood pressure, self measurement: Patient states that she does not measure blood pressure from home. She checks her blood pressure N/A. She shares her readings with a health care provider: N/A.   Nutrition: Patient states that on average she eats 0 cups of fruit and 0 cups of vegetables per day. Patient states that she does not eat fish at least 2 times per week. Patient eats less than half servings of whole grains. Patient drinks less than 36 ounces of beverages with added sugar weekly: yes. Patient is currently watching sodium or salt intake: no. In the past 7 days patient has consumed drinks containing alcohol on 0 days. On a day that patient consumes drinks containing alcohol on average 0 drinks are consumed.      Physical activity: Patient states that she gets 0 minutes of moderate and 0 minutes of vigorous physical activity each week.  Smoking status: Patient states that she has has never smoked .   Quality of life: Over the past 2 weeks patient states that she had little interest or pleasure in doing things: more than half. She has been feeling down, depressed or hopeless:more than half.   Social Determinants of Health Assessment:   Computer Use: During the last 12 months patient states that she has used any of the following: desktop/laptop, smart phone or tablet/other portable wireless computer: no.   Internet Use:  During the last 12 months, did you or any member of your household have access to the internet: No access to internet in this house,apartment or mobile home.   Food Insecurities: During the last 12 months, where there any times when you were worried that you would run out of food because of a lack of money or other resources: Yes.   Transportation Barriers: During the last 12 months, have you missed a doctor's appointment because of transportation problems: Yes.   Childcare Barriers: If you are currently using childcare services, please identify  the type of services you use. (If not using childcare services, please select "Not applicable"): not applicable. During the last 12 months, have you had any barriers to childcare services such as: not applicable.   Housing: What is your housing situation today: I have housing, but I am worried about losing my housing.   Intimate Partner Violence: During the last 12 months, how often did your partner physically hurt you: never. During the last 12 months, how often did your partner insult you or talk down to you: never.  Medication Adherence: During the last 12 months, did you ever forget to take your medicine: not applicable. During the last 12 months, were you careless ar times about taking your medicine: not applicable. During the last 12 months, when you felt better did you sometimes stop taking your medication: not applicable. During the last 12 months, sometimes if you felt worse when you took your medicine did you stop taking  it: not applicable.   Risk reduction and counseling:   Health Coaching: Did not do an in-depth health coaching session with patient. Will give more recommendations once labs result. Spoke primarily with patient about getting her access to resources. Will refer her for counseling services, food and housing resources. Will get her connected to transportation services with [facility].   Goal: Patient did not set goal. Will check back  with patient about goal setting once more resources are in place for the patient.    Navigation:  I will notify patient of lab results.  Patient is aware of 2 more health coaching sessions and a follow up. Will refer patient through [id number] for support services. Provided patient with shopping experience at the [employer].  Time: 30 minutes

## 2023-02-12 LAB — LIPID PANEL
Chol/HDL Ratio: 4.1 ratio (ref 0.0–4.4)
Cholesterol, Total: 200 mg/dL — ABNORMAL HIGH (ref 100–199)
HDL: 49 mg/dL (ref >39)
LDL Chol Calc (NIH): 122 mg/dL — ABNORMAL HIGH (ref 0–99)
Triglycerides: 166 mg/dL — ABNORMAL HIGH (ref 0–149)
VLDL Cholesterol Cal: 29 mg/dL (ref 5–40)

## 2023-02-12 LAB — HEMOGLOBIN A1C
Est. average glucose Bld gHb Est-mCnc: 108 mg/dL
Hgb A1c MFr Bld: 5.4 % (ref 4.8–5.6)

## 2023-02-12 LAB — GLUCOSE, RANDOM: Glucose: 93 mg/dL (ref 70–99)

## 2023-02-18 ENCOUNTER — Telehealth: Payer: Self-pay

## 2023-02-18 NOTE — Telephone Encounter (Signed)
Health coaching 2   interpreter- Natale Lay, [employer]   Labs-200 cholesterol, 122 LDL cholesterol, 166 triglycerides, 49 HDL cholesterol, 5.4 hemoglobin A1C, 93 mean plasma glucose. Patient understands and is aware of her lab results.   Goals-  1. Reduce the amount of fried and fatty foods consumed. Try to grill, bake, broil or sautee foods instead. 2. Reduce the amount of red meats consumed. Substitute for lean proteins like chicken and fish. 3. Increase fruit and vegetable intake as well as whole grains. 4. Daily exercise for 20-30 minutes.    Navigation:  Patient is aware of 1 more health coaching sessions and a follow up. Will refer patient for FU to Internal Medicine for elevated labs.   Time- 10 minutes

## 2023-03-18 ENCOUNTER — Encounter: Payer: Self-pay | Admitting: Student

## 2023-03-26 ENCOUNTER — Ambulatory Visit: Payer: Self-pay

## 2023-09-05 ENCOUNTER — Other Ambulatory Visit: Payer: Self-pay

## 2023-09-05 DIAGNOSIS — N6310 Unspecified lump in the right breast, unspecified quadrant: Secondary | ICD-10-CM

## 2023-09-26 ENCOUNTER — Ambulatory Visit
Admission: RE | Admit: 2023-09-26 | Discharge: 2023-09-26 | Disposition: A | Discharge status: Home / Self Care | Payer: No Typology Code available for payment source | Source: Ambulatory Visit | Attending: Obstetrics and Gynecology | Admitting: Obstetrics and Gynecology

## 2023-09-26 ENCOUNTER — Other Ambulatory Visit: Payer: Self-pay | Admitting: Obstetrics and Gynecology

## 2023-09-26 ENCOUNTER — Ambulatory Visit: Payer: Self-pay | Admitting: *Deleted

## 2023-09-26 ENCOUNTER — Ambulatory Visit
Admission: RE | Admit: 2023-09-26 | Discharge: 2023-09-26 | Disposition: A | Discharge status: Home / Self Care | Payer: Self-pay | Source: Ambulatory Visit | Attending: Obstetrics and Gynecology | Admitting: Obstetrics and Gynecology

## 2023-09-26 VITALS — BP 99/65 | Wt 143.0 lb

## 2023-09-26 DIAGNOSIS — Z01419 Encounter for gynecological examination (general) (routine) without abnormal findings: Secondary | ICD-10-CM

## 2023-09-26 DIAGNOSIS — N644 Mastodynia: Secondary | ICD-10-CM

## 2023-09-26 DIAGNOSIS — N6310 Unspecified lump in the right breast, unspecified quadrant: Secondary | ICD-10-CM

## 2023-09-26 DIAGNOSIS — N6452 Nipple discharge: Secondary | ICD-10-CM

## 2023-09-26 NOTE — Progress Notes (Signed)
Ms. Theora Vankirk is a 43 y.o. [id number] female who presents to [facility] clinic today with complaint of right axillary lump x 1 month that is painful. Patient states the pain comes and goes. Patient rates the pain at a 5 out of 10. Patient complained of right breast pain where she had a clip placed in 2023 when her breast biopsy was completed that has been increasing pain. Patient rates the pain at a 9 out of 10. Complaints of bilateral clear breast discharge when expresses x 2 years.   Pap Smear: Pap smear completed today. Last Pap smear was 01/16/2022 at [facility] and was normal with positive HPV. Per patient has no history of an abnormal Pap smear or positive HPV prior to her most recent Pap smear. Last Pap smear result is available in Epic.   Physical exam: Breasts Breasts symmetrical. No skin abnormalities bilateral breasts. No nipple retraction bilateral breasts. Expressed a thick white milky discharge from bilateral breasts on exam. Sample of bilateral breast discharge sent to Cytology for evaluation. No lymphadenopathy. No lumps palpated left breast. Palpated a pea sized lump versus thickening within the right breast at 10 o'clock 12 cm from the nipple. Complaints of right upper outer breast pain on exam that was noted on previous exam 02/15/2022 that per patient has increased. Complaints of left outer breast pain on exam.    MS 3D DIAG MAMMO UNI RT BR (aka MM) Result Date: 09/26/2023 CLINICAL DATA:  43 year old female presenting for evaluation of a new lump and tenderness in the outer right breast. This is present for approximately 1 month. EXAM: DIGITAL DIAGNOSTIC UNILATERAL RIGHT MAMMOGRAM WITH TOMOSYNTHESIS AND CAD; ULTRASOUND RIGHT BREAST LIMITED TECHNIQUE: Right digital diagnostic mammography and breast tomosynthesis was performed. The images were evaluated with computer-aided detection. ; Targeted ultrasound examination of the right breast was performed COMPARISON:  Previous exam(s).  ACR Breast Density Category c: The breasts are heterogeneously dense, which may obscure small masses. FINDINGS: Mammogram: A skin BB marks the palpable site of concern reported by the patient in the far outer right breast. A spot tangential view of this area was performed in addition to standard views. There is no abnormality at the site of concern. A spot compression tomosynthesis views also performed for a questioned asymmetry in the central posterior right breast. The asymmetry resolves on the spot imaging. On physical exam at the site of concern reported by the patient in the far outer right breast I do not feel a discrete mass or focal area of thickening. Ultrasound: Targeted ultrasound is performed at the palpable site of concern in the right breast at 10 o'clock 12 cm from the nipple demonstrating no cystic or solid mass or focal area of shadowing. Incidentally noted is a deep normal lymph node measuring 1.2 cm. IMPRESSION: No mammographic or sonographic abnormality at the palpable site of concern in the right breast. RECOMMENDATION: 1. Recommend any further workup of the palpable/painful site in the right breast be on a clinical basis given negative imaging findings. 2. Return to routine annual screening mammography which will be due in April 2025. I have discussed the findings and recommendations with the patient. If applicable, a reminder letter will be sent to the patient regarding the next appointment. BI-RADS CATEGORY  1: Negative. Electronically Signed   By: Emmaline Kluver M.D.   On: 09/26/2023 15:43   MS 3D DIAG MAMMO BILAT BR (aka MM) Result Date: 12/06/2022 CLINICAL DATA:  Patient presents for bilateral non spontaneous white nipple  discharge. Additionally patient reports focal tenderness within the outer left breast. EXAM: DIGITAL DIAGNOSTIC BILATERAL MAMMOGRAM WITH TOMOSYNTHESIS; ULTRASOUND RIGHT BREAST LIMITED; ULTRASOUND LEFT BREAST LIMITED TECHNIQUE: Bilateral digital diagnostic  mammography and breast tomosynthesis was performed.; Targeted ultrasound examination of the right breast was performed; Targeted ultrasound examination of the left breast was performed. COMPARISON:  Previous exam(s). ACR Breast Density Category c: The breasts are heterogeneously dense, which may obscure small masses. FINDINGS: No concerning masses, calcifications or distortion identified within either breast. X shaped marking clip posterior right breast. Targeted ultrasound is performed, showing normal tissue within the retroareolar breast bilaterally. Normal tissue within the left breast 3 o'clock position 9 cm from nipple at the site of focal tenderness. IMPRESSION: Physiologic spontaneous bilateral white nipple discharge. No suspicious abnormality at the site of tenderness outer left breast. Patient reports a sister with diagnosis of breast cancer at a young age. RECOMMENDATION: Recommendation for referral for genetics evaluation and possible bilateral breast MRI given sister with history of breast cancer at a young age. Continued clinical evaluation for bilateral nipple discharge. Screening mammogram in one year.(Code:[id number]) I have discussed the findings and recommendations with the patient. If applicable, a reminder letter will be sent to the patient regarding the next appointment. BI-RADS CATEGORY  2: Benign. Electronically Signed   By: Annia Belt M.D.   On: 12/06/2022 16:10   MS DIGITAL DIAG TOMO UNI RIGHT Result Date: 02/15/2022 CLINICAL DATA:  43 year old female presenting for evaluation of a possible lump as well as pain in the right breast at the site of recent biopsy. Patient had a benign right breast biopsy in June 2023 demonstrating benign tissue. Patient has a history of breast cancer in her sister at age 59. EXAM: DIGITAL DIAGNOSTIC UNILATERAL RIGHT MAMMOGRAM WITH TOMOSYNTHESIS AND CAD; ULTRASOUND RIGHT BREAST LIMITED TECHNIQUE: Right digital diagnostic mammography and breast tomosynthesis was  performed. The images were evaluated with computer-aided detection.; Targeted ultrasound examination of the right breast was performed COMPARISON:  Previous exam(s). ACR Breast Density Category c: The breast tissue is heterogeneously dense, which may obscure small masses. FINDINGS: Mammogram: Right breast: A skin BB marks the site of the lump reported by the patient in the superior central right breast. A spot tangential view of this area was performed in addition to standard views. There is no new abnormality at the palpable site. An X shaped biopsy marking clip is seen at the site of recent benign biopsy in the right breast. There are mild post biopsy changes. No new mass. On physical exam at the site of concern reported by the patient in the superior central right breast I do not feel a discrete mass or focal area of thickening. At the site of recent biopsy the also in superior breast I do not feel a discrete mass or focal area of thickening. There is no erythema or rash. Ultrasound: Targeted ultrasound performed at the site of concern reported by the patient in the right breast at 12 o'clock 6 cm from the nipple demonstrating no cystic or solid mass. IMPRESSION: 1. No mammographic or sonographic evidence of malignancy or other imaging abnormality at the palpable site of concern reported by the patient in the right breast at 12 o'clock. 2.  Stable benign biopsy site in the right breast. RECOMMENDATION: 1. Recommend any further workup of the palpable site in the right breast be on a clinical basis. 2. Counseled the patient at biopsy site pain may take several weeks to subside. 3. Consider risk assessment to determine  the patient's lifetime risk of breast cancer given her family history, if this has not already been performed. Per American Cancer Society guidelines, if the patient has a calculated lifetime risk of developing breast cancer of greater than 20%, annual screening MRI of the breasts would be recommended  at the time of screening mammography. I have discussed the findings and recommendations with the patient. If applicable, a reminder letter will be sent to the patient regarding the next appointment. BI-RADS CATEGORY  2: Benign. Electronically Signed   By: Emmaline Kluver M.D.   On: 02/15/2022 15:17  MM RT BREAST BX W LOC DEV 1ST LESION IMAGE BX SPEC STEREO GUIDE Addendum Date: 01/31/2022 ADDENDUM REPORT: 01/31/2022 08:10 ADDENDUM: Pathology revealed Breast, right, needle core biopsy, upper, x shaped clip- BENIGN BREAST PARENCHYMA WITH FOCAL FIBROCYSTIC CHANGE. - NEGATIVE FOR CARCINOMA. This was found to be concordant by Dr. Baird Lyons. Pathology results were discussed with the patient by telephone with Durene Cal Bilingual Patient Services Representative. The patient reported doing well after the biopsy with tenderness at the site. Post biopsy instructions and care were reviewed and questions were answered. The patient was encouraged to call [facility] for any additional concerns. The patient was instructed to continue with monthly self breast examinations, clinical follow-up as needed, and to return for annual mammography at 42. The patient was informed a reminder notice would be sent regarding this appointment. Pathology results reported by Collene Mares RN on 01/30/2022. Electronically Signed   By: Baird Lyons M.D.   On: 01/31/2022 08:10   Result Date: 01/31/2022 CLINICAL DATA:  Right breast mass. EXAM: Choose 3 BREAST STEREOTACTIC CORE NEEDLE BIOPSY COMPARISON:  None Available. FINDINGS: The patient and I discussed the procedure of stereotactic-guided biopsy including benefits and alternatives. We discussed the high likelihood of a successful procedure. We discussed the risks of the procedure including infection, bleeding, tissue injury, clip migration, and inadequate sampling. Informed written consent was given. The usual time out protocol was performed immediately prior to  the procedure. Using sterile technique and 1% lidocaine and 1% lidocaine with epinephrine as local anesthetic, under stereotactic guidance, a 9 gauge vacuum assisted device was used to perform core needle biopsy of an asymmetry in the upper slightly outer quadrant of the right breast using a lateral to medial approach. Lesion quadrant: Upper-outer quadrant At the conclusion of the procedure, X shaped tissue marker clip was deployed into the biopsy cavity. Follow-up 2-view mammogram was performed and dictated separately. IMPRESSION: Stereotactic-guided biopsy of the right breast. No apparent complications. Electronically Signed: By: Baird Lyons M.D. On: 01/26/2022 09:33  MM CLIP PLACEMENT RIGHT Result Date: 01/26/2022 CLINICAL DATA:  Status post stereotactic biopsy of the right breast. EXAM: 3D DIAGNOSTIC RIGHT MAMMOGRAM POST STEREOTACTIC BIOPSY COMPARISON:  Previous exam(s). FINDINGS: 3D Mammographic images were obtained following stereotactic guided biopsy of the right breast. The biopsy marking clip is in the far posterior aspect of the upper-outer quadrant of the right breast. IMPRESSION: Appropriate positioning of the X shaped biopsy marking clip at the site of biopsy in the upper-outer quadrant of the right breast. Final Assessment: Post Procedure Mammograms for Marker Placement Electronically Signed   By: Baird Lyons M.D.   On: 01/26/2022 09:34  MS DIGITAL DIAG TOMO BILAT Result Date: 01/16/2022 CLINICAL DATA:  Palpable lump in the right breast. Baseline mammography. Milky nipple discharge on the right. EXAM: DIGITAL DIAGNOSTIC BILATERAL MAMMOGRAM WITH TOMOSYNTHESIS AND CAD; ULTRASOUND RIGHT BREAST LIMITED TECHNIQUE: Bilateral digital diagnostic  mammography and breast tomosynthesis was performed. The images were evaluated with computer-aided detection.; Targeted ultrasound examination of the right breast was performed COMPARISON:  None available. ACR Breast Density Category c: The breast tissue is  heterogeneously dense, which may obscure small masses. FINDINGS: There is a mass in the superior right breast on the MLO view only just above the level of the nipple at a posterior depth measuring 1.4 cm. The mass persists on additional imaging. No other suspicious mammographic findings are identified bilaterally. On physical exam, no suspicious lumps are identified. Targeted ultrasound is performed, showing no abnormality in the retroareolar region to correlate with milky discharge. No abnormality identified in the region of the palpable lump on the right. No sonographic correlate for the right breast mass only seen on the MLO view at a posterior depth just above the level the nipple. IMPRESSION: Indeterminate right breast mass only visualized on the MLO view, not definitely a lymph node. No cause for the palpable lump identified. No cause for the milky nipple discharge identified. No suspicious findings otherwise seen in either breast. RECOMMENDATION: Recommend stereotactic biopsy of the right breast mass located on the MLO view at a posterior depth. I have discussed the findings and recommendations with the patient. If applicable, a reminder letter will be sent to the patient regarding the next appointment. BI-RADS CATEGORY  4: Suspicious. Electronically Signed   By: Gerome Sam III M.D.   On: 01/16/2022 14:42   Pelvic/Bimanual Ext Genitalia No lesions, no swelling and no discharge observed on external genitalia.        Vagina Vagina pink and normal texture. No lesions or discharge observed in vagina.        Cervix Cervix is present. Cervix pink and of normal texture. No discharge observed.    Uterus Uterus is present and palpable. Uterus in normal position and normal size.        Adnexae Bilateral ovaries present and palpable. No tenderness on palpation.         Rectovaginal No rectal exam completed today since patient had no rectal complaints. No skin abnormalities observed on exam.      Smoking History: Patient has never smoked.   Patient Navigation: Patient education provided. Access to services provided for patient through [facility] program. Spanish interpreter Natale Lay from [facility] provided. Patient has food insecurities. Patient escorted to the food market at the [employer] for groceries.   Breast and Cervical Cancer Risk Assessment: Patient has family history of two sisters having breast cancer. Patient has no known genetic mutations or history of radiation treatment to the chest before age 56. Patient does not have history of cervical dysplasia, immunocompromised, or DES exposure in-utero.    Risk Scores as of Encounter on 09/26/2023     Dondra Spry           5-year 0.4%   Lifetime 8.7%            Last calculated by Caprice Red, CMA on 09/26/2023 at  1:39 PM        A: [facility] exam with pap smear Complaint of right breast lump, pain, and bilateral breast discharge.  P: Referred patient to the [facility] for a diagnostic mammogram. Appointment scheduled Thursday, September 26, 2023 at 1520.  Priscille Heidelberg, RN 09/26/2023 2:20 PM

## 2023-09-26 NOTE — Patient Instructions (Signed)
Explained breast self awareness with Barbee Shropshire Lingle. Pap smear completed today. Let her know [facility]CP will cover Pap smears and HPV typing every 5 years unless has a history of abnormal Pap smears. Referred patient to the [facility] for a diagnostic mammogram. Appointment scheduled Thursday, September 26, 2023 at 1520. Patient aware of appointment and will be there. Let patient know will follow up with her within the next couple weeks with results of Pap smear and breast discharge by letter or phone. Barbee Shropshire Vasey verbalized understanding.  Makinzey Banes, Kathaleen Maser, RN 2:20 PM

## 2023-09-27 ENCOUNTER — Other Ambulatory Visit (HOSPITAL_COMMUNITY)
Admission: RE | Admit: 2023-09-27 | Discharge: 2023-09-27 | Disposition: A | Discharge status: Home / Self Care | Payer: No Typology Code available for payment source | Source: Ambulatory Visit | Attending: Obstetrics and Gynecology | Admitting: Obstetrics and Gynecology

## 2023-09-27 DIAGNOSIS — N6452 Nipple discharge: Secondary | ICD-10-CM | POA: Insufficient documentation

## 2023-09-27 NOTE — Addendum Note (Signed)
Addended by: Priscille Heidelberg on: 09/27/2023 10:14 AM   Modules accepted: Orders

## 2023-09-30 ENCOUNTER — Telehealth: Payer: Self-pay

## 2023-09-30 ENCOUNTER — Other Ambulatory Visit: Payer: Self-pay

## 2023-09-30 DIAGNOSIS — Z803 Family history of malignant neoplasm of breast: Secondary | ICD-10-CM

## 2023-09-30 LAB — CYTOLOGY - NON PAP

## 2023-09-30 NOTE — Telephone Encounter (Signed)
Using [address] [place], [place] [address], I attempted to reach the pt 3 times and there was no voicemail or response to "hello". The call appeared to right to dead air.

## 2023-09-30 NOTE — Progress Notes (Signed)
Referral has been sent to Genetics for assess given pts family history of two sisters dx with breast cancer. One sister dx at age 41yo and the other recently dx at 41yo.

## 2023-10-02 ENCOUNTER — Telehealth: Payer: Self-pay | Admitting: Genetic Counselor

## 2023-10-02 LAB — CYTOLOGY - PAP
Comment: NEGATIVE
Diagnosis: NEGATIVE
High risk HPV: NEGATIVE

## 2023-10-02 NOTE — Telephone Encounter (Signed)
 Renee Knight

## 2023-10-04 ENCOUNTER — Telehealth: Payer: Self-pay | Admitting: Genetic Counselor

## 2023-10-04 NOTE — Telephone Encounter (Signed)
 Renee Knight

## 2024-03-23 IMAGING — US US BREAST*R* LIMITED INC AXILLA
1 series · 3 of 3 positions shown · non-contrast
Comparison: Previous exam(s).

CLINICAL DATA: 40-year-old female presenting for evaluation of a
possible lump as well as pain in the right breast at the site of
recent biopsy. Patient had a benign right breast biopsy in January 2022
demonstrating benign tissue. Patient has a history of breast cancer
in her sister at age 27.

EXAM:
DIGITAL DIAGNOSTIC UNILATERAL RIGHT MAMMOGRAM WITH TOMOSYNTHESIS AND
CAD; ULTRASOUND RIGHT BREAST LIMITED
TECHNIQUE: Right digital diagnostic mammography and breast tomosynthesis was
performed. The images were evaluated with computer-aided detection.;
Targeted ultrasound examination of the right breast was performed

[Series 1: us breast*right* limited inc axilla · 0.07mm/px · 3 of 3 slices shown]
[im 1/3]
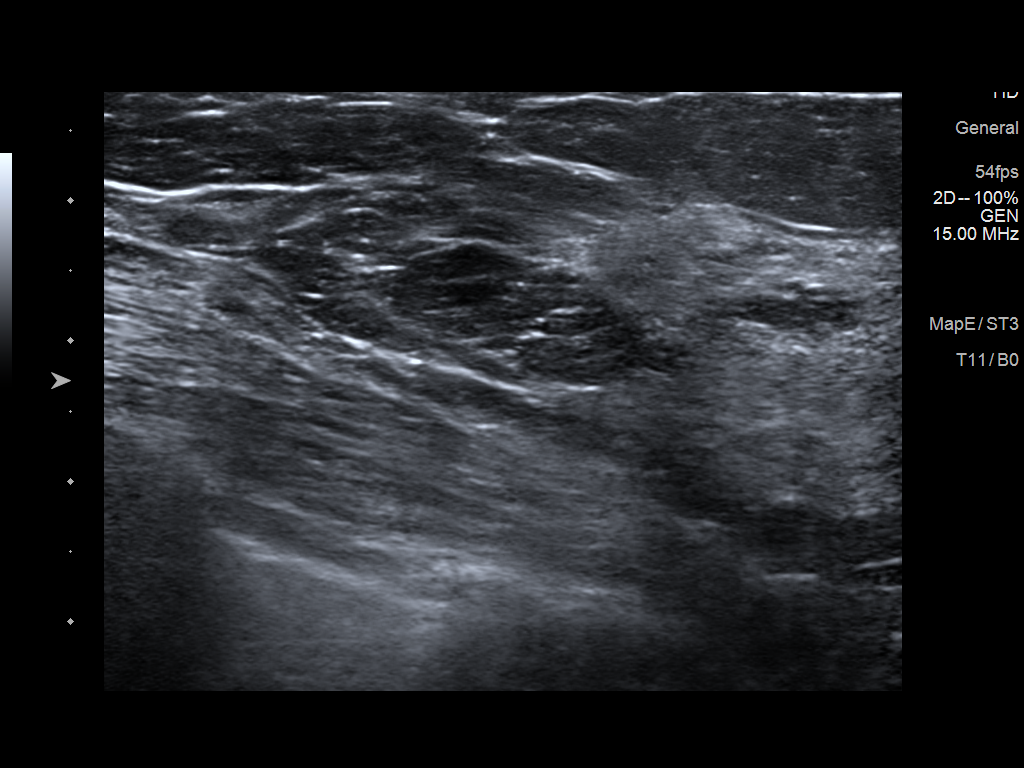
[im 2/3]
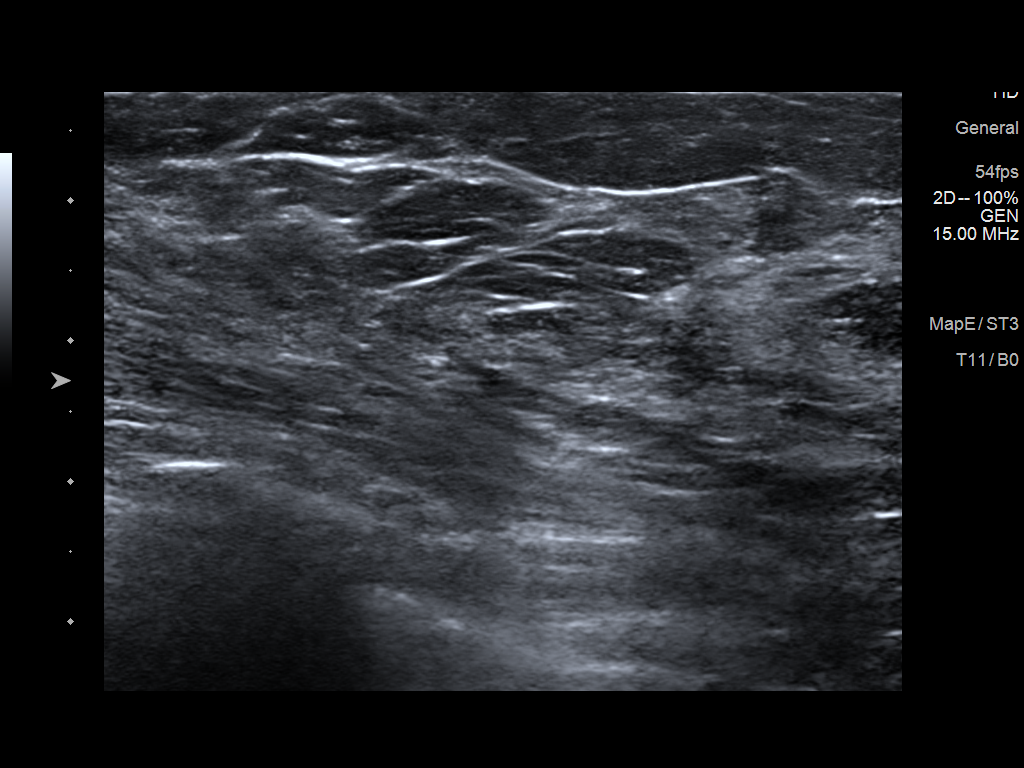
[im 3/3]
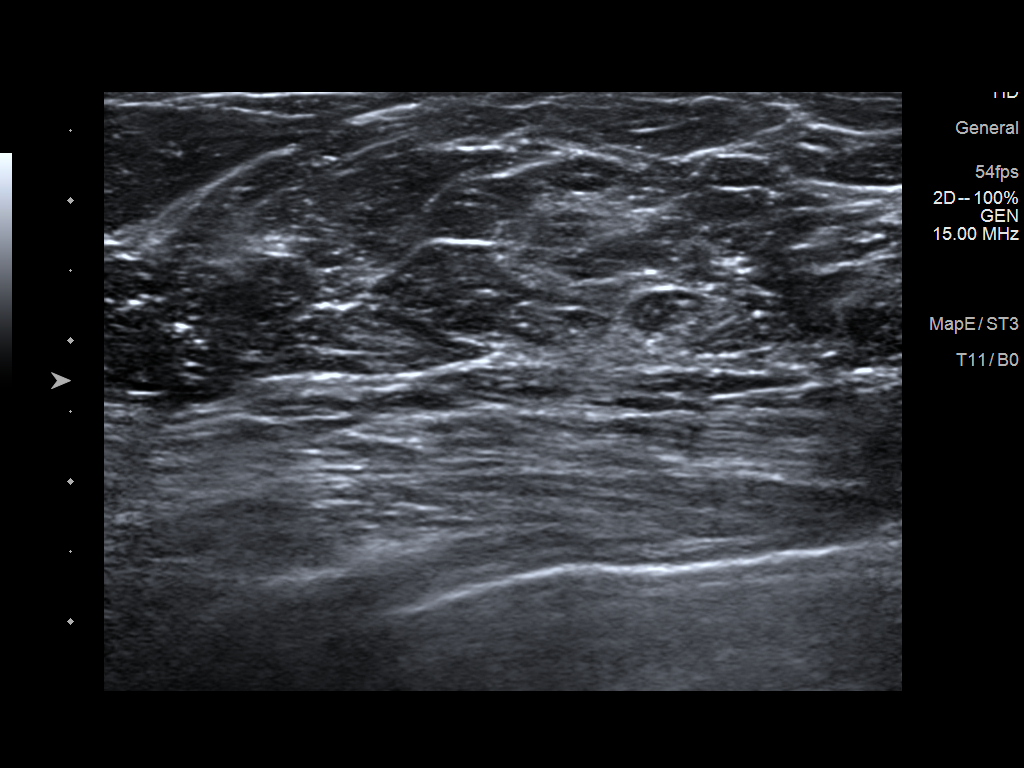

[3 of 3 positions shown; findings below may reference images not displayed]

ACR Breast Density Category c: The breast tissue is heterogeneously
dense, which may obscure small masses.
FINDINGS: Mammogram:

Right breast: A skin BB marks the site of the lump reported by the
patient in the superior central right breast. A spot tangential view
of this area was performed in addition to standard views. There is
no new abnormality at the palpable site. An X shaped biopsy marking
clip is seen at the site of recent benign biopsy in the right
breast. There are mild post biopsy changes. No new mass.

On physical exam at the site of concern reported by the patient in
the superior central right breast I do not feel a discrete mass or
focal area of thickening. At the site of recent biopsy the also in
superior breast I do not feel a discrete mass or focal area of
thickening. There is no erythema or rash.

Ultrasound:

Targeted ultrasound performed at the site of concern reported by the
patient in the right breast at 12 o'clock 6 cm from the nipple
demonstrating no cystic or solid mass.
IMPRESSION: 1. No mammographic or sonographic evidence of malignancy or other
imaging abnormality at the palpable site of concern reported by the
patient in the right breast at 12 o'clock.

2.  Stable benign biopsy site in the right breast.

RECOMMENDATION:
1. Recommend any further workup of the palpable site in the right
breast be on a clinical basis.

2. Counseled the patient at biopsy site pain may take several weeks
to subside.

3. Consider risk assessment to determine the patient's lifetime risk
of breast cancer given her family history, if this has not already
been performed. Per American Cancer Society guidelines, if the
patient has a calculated lifetime risk of developing breast cancer
of greater than 20%, annual screening MRI of the breasts would be
recommended at the time of screening mammography.

I have discussed the findings and recommendations with the patient.
If applicable, a reminder letter will be sent to the patient
regarding the next appointment.

BI-RADS CATEGORY  2: Benign.

## 2024-05-25 NOTE — Progress Notes (Signed)
  Renee Knight is receiving her prenatal care at the [facility].  She is here for ultrasound only.  Ultrasound has been performed, reviewed, and will be scanned into Epic.  Copies of today's ultrasound will be sent along with this note to the Health Dept.  BP 100/60   Wt 64.9 kg (143 lb)   LMP 01/07/2024   BMI 27.02 kg/m   Impression: IUP at [redacted]w[redacted]d Normal anatomic survey- Gender is Female, Placenta is anterior, incomplete Estimated Date of Delivery: 10/13/24  Recommendations: Routine prenatal care Repeat ultrasound at 28 weeks for completion of anatomy  Return in about 8 days (around 06/02/2024) for Repeat OB Ultrasound.  Full problem list as below.  Problem List Items Addressed This Visit       Pregnancy 2026   Pregnancy (CMD)   Overview  HD patient LMP 01/07/24 [id number], [id number] in [place], Spanish speaking, AMA Dating by LMP consistent with 13 and 20-week sono. Best EDC: 10-13-2024 NIPS: pending Early 1 hr. gtt.:  113 passed Anatomy Scan: Female, anterior placenta, incomplete Regular timed 1 hr. gtt.: pending Repeat ultrasound at 28 weeks for completion of anatomy: Pending GBS status: pending       AMA (advanced maternal age) multigravida 35+ (CMD) - Primary   Overview   Given advanced maternal age of greater than 18 years old, patient will need increased fetal surveillance, including monthly ultrasounds to start at 28 weeks as well as weekly biophysical profiles to start at 34-36 weeks.  If this increased testing cannot be accommodated at the health department, recommend transfer to [facility].
